# Patient Record
Sex: Female | Born: 1955 | Hispanic: Yes
Health system: Southern US, Community
[De-identification: ages and names within clinical notes are randomized; demographics above are authoritative.]

## PROBLEM LIST (undated history)

## (undated) DIAGNOSIS — I10 Essential (primary) hypertension: Secondary | ICD-10-CM

## (undated) DIAGNOSIS — E119 Type 2 diabetes mellitus without complications: Secondary | ICD-10-CM

## (undated) HISTORY — PX: CHOLECYSTECTOMY: SHX55

---

## 2014-08-20 ENCOUNTER — Emergency Department (HOSPITAL_COMMUNITY): Payer: No Typology Code available for payment source

## 2014-08-20 ENCOUNTER — Emergency Department (HOSPITAL_COMMUNITY)
Admission: EM | Admit: 2014-08-20 | Discharge: 2014-08-20 | Disposition: A | Discharge status: Home / Self Care | Payer: No Typology Code available for payment source | Attending: Emergency Medicine | Admitting: Emergency Medicine

## 2014-08-20 ENCOUNTER — Encounter (HOSPITAL_COMMUNITY): Payer: Self-pay | Admitting: Emergency Medicine

## 2014-08-20 DIAGNOSIS — F411 Generalized anxiety disorder: Secondary | ICD-10-CM | POA: Insufficient documentation

## 2014-08-20 DIAGNOSIS — Y9389 Activity, other specified: Secondary | ICD-10-CM | POA: Insufficient documentation

## 2014-08-20 DIAGNOSIS — J029 Acute pharyngitis, unspecified: Secondary | ICD-10-CM | POA: Insufficient documentation

## 2014-08-20 DIAGNOSIS — S298XXA Other specified injuries of thorax, initial encounter: Secondary | ICD-10-CM | POA: Diagnosis not present

## 2014-08-20 DIAGNOSIS — Y92410 Unspecified street and highway as the place of occurrence of the external cause: Secondary | ICD-10-CM | POA: Diagnosis not present

## 2014-08-20 DIAGNOSIS — R000 Tachycardia, unspecified: Secondary | ICD-10-CM | POA: Diagnosis not present

## 2014-08-20 DIAGNOSIS — E119 Type 2 diabetes mellitus without complications: Secondary | ICD-10-CM | POA: Diagnosis not present

## 2014-08-20 DIAGNOSIS — I10 Essential (primary) hypertension: Secondary | ICD-10-CM | POA: Diagnosis not present

## 2014-08-20 HISTORY — DX: Type 2 diabetes mellitus without complications: E11.9

## 2014-08-20 HISTORY — DX: Essential (primary) hypertension: I10

## 2014-08-20 LAB — I-STAT CHEM 8, ED
BUN: 25 mg/dL — ABNORMAL HIGH (ref 6–23)
CALCIUM ION: 1.22 mmol/L (ref 1.12–1.23)
CHLORIDE: 105 meq/L (ref 96–112)
Creatinine, Ser: 0.8 mg/dL (ref 0.50–1.10)
GLUCOSE: 289 mg/dL — AB (ref 70–99)
HCT: 46 % (ref 36.0–46.0)
HEMOGLOBIN: 15.6 g/dL — AB (ref 12.0–15.0)
Potassium: 4.1 mEq/L (ref 3.7–5.3)
Sodium: 138 mEq/L (ref 137–147)
TCO2: 24 mmol/L (ref 0–100)

## 2014-08-20 LAB — I-STAT TROPONIN, ED: Troponin i, poc: 0 ng/mL (ref 0.00–0.08)

## 2014-08-20 LAB — CBG MONITORING, ED: Glucose-Capillary: 294 mg/dL — ABNORMAL HIGH (ref 70–99)

## 2014-08-20 MED ORDER — HYDROCODONE-ACETAMINOPHEN 5-325 MG PO TABS
1.0000 | ORAL_TABLET | Freq: Once | ORAL | Status: AC
  Administered 2014-08-20: 1 via ORAL
  Filled 2014-08-20: qty 1

## 2014-08-20 MED ORDER — METHOCARBAMOL 500 MG PO TABS: 500.0000 mg | ORAL_TABLET | Freq: Two times a day (BID) | ORAL | Status: AC

## 2014-08-20 MED ORDER — LORAZEPAM 1 MG PO TABS
1.0000 mg | ORAL_TABLET | Freq: Once | ORAL | Status: AC
  Administered 2014-08-20: 1 mg via ORAL
  Filled 2014-08-20: qty 2

## 2014-08-20 MED ORDER — HYDROCODONE-ACETAMINOPHEN 5-325 MG PO TABS
2.0000 | ORAL_TABLET | ORAL | Status: AC | PRN
Start: 2014-08-20 — End: ?
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
## 2014-08-20 NOTE — ED Notes (Signed)
Lab at bedside
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
## 2014-08-20 NOTE — ED Notes (Signed)
Dr. Jacubowitz at bedside 

## 2014-08-21 NOTE — ED Provider Notes (Signed)
Medical screening examination/treatment/procedure(s) were conducted as a shared visit with non-physician practitioner(s) and myself.  I personally evaluated the patient during the encounter.   EKG Interpretation   Date/Time:  Tuesday August 20 2014 17:45:45 EDT Ventricular Rate:  109 PR Interval:  160 QRS Duration: 98 QT Interval:  340 QTC Calculation: 457 R Axis:   65 Text Interpretation:  Sinus tachycardia Nonspecific ST and T wave  abnormality Abnormal ECG No old tracing to compare Confirmed by Ethelda Chick   MD, Sharissa Brierley [phone]) on 08/20/2014 8:51:40 PM       Doug Sou, MD 08/21/14 6045
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
